# Patient Record
Sex: Male | Born: 1990 | Race: Black or African American | Hispanic: No | Marital: Single | Smoking: Never smoker
Health system: Southern US, Community
[De-identification: ages and names within clinical notes are randomized; demographics above are authoritative.]

## PROBLEM LIST (undated history)

## (undated) DIAGNOSIS — N200 Calculus of kidney: Secondary | ICD-10-CM

---

## 2012-04-11 ENCOUNTER — Encounter (HOSPITAL_COMMUNITY): Payer: Self-pay | Admitting: *Deleted

## 2012-04-11 ENCOUNTER — Emergency Department (HOSPITAL_COMMUNITY): Payer: BC Managed Care – PPO

## 2012-04-11 ENCOUNTER — Emergency Department (HOSPITAL_COMMUNITY)
Admission: EM | Admit: 2012-04-11 | Discharge: 2012-04-11 | Disposition: A | Discharge status: Home / Self Care | Payer: BC Managed Care – PPO | Attending: Emergency Medicine | Admitting: Emergency Medicine

## 2012-04-11 DIAGNOSIS — R109 Unspecified abdominal pain: Secondary | ICD-10-CM | POA: Insufficient documentation

## 2012-04-11 DIAGNOSIS — N201 Calculus of ureter: Secondary | ICD-10-CM | POA: Insufficient documentation

## 2012-04-11 LAB — URINALYSIS, MICROSCOPIC ONLY
Bilirubin Urine: NEGATIVE
Ketones, ur: NEGATIVE mg/dL
Nitrite: NEGATIVE
Specific Gravity, Urine: 1.026 (ref 1.005–1.030)
Urobilinogen, UA: 0.2 mg/dL (ref 0.0–1.0)
pH: 7.5 (ref 5.0–8.0)

## 2012-04-11 LAB — BASIC METABOLIC PANEL
BUN: 14 mg/dL (ref 6–23)
Chloride: 102 mEq/L (ref 96–112)
Creatinine, Ser: 1.14 mg/dL (ref 0.50–1.35)
GFR calc Af Amer: >90 mL/min (ref >90)
GFR calc non Af Amer: >90 mL/min (ref >90)
Potassium: 3.9 mEq/L (ref 3.5–5.1)

## 2012-04-11 LAB — CBC
MCHC: 34.1 g/dL (ref 30.0–36.0)
Platelets: 200 10*3/uL (ref 150–400)
RDW: 13.2 % (ref 11.5–15.5)
WBC: 4.9 10*3/uL (ref 4.0–10.5)

## 2012-04-11 MED ORDER — OXYCODONE-ACETAMINOPHEN 5-325 MG PO TABS: 1.0000 | ORAL_TABLET | ORAL | Status: AC | PRN

## 2012-04-11 MED ORDER — HYDROMORPHONE HCL PF 1 MG/ML IJ SOLN
1.0000 mg | Freq: Once | INTRAMUSCULAR | Status: AC
  Administered 2012-04-11: 1 mg via INTRAVENOUS
  Filled 2012-04-11: qty 1

## 2012-04-11 MED ORDER — ONDANSETRON 8 MG PO TBDP: 8.0000 mg | ORAL_TABLET | Freq: Three times a day (TID) | ORAL | Status: AC | PRN

## 2012-04-11 NOTE — ED Notes (Signed)
Pt states "having pain in my testicles, mainly my right, also right fingers and toes going numb"

## 2012-04-11 NOTE — ED Notes (Signed)
Patient transported to Ultrasound 

## 2012-04-11 NOTE — ED Provider Notes (Signed)
History     CSN: [id number]  Arrival date & time 04/11/12  0901   First MD Initiated Contact with Patient 04/11/12 0920      Chief Complaint  Patient presents with  . Testicle Pain     The history is provided by the patient.   the patient reports he awoke this morning with acute onset right testicular pain.  Reports the pain radiates up into his right groin.  He denies radiation of his pain to his right flank.  He has no dysuria or urinary frequency.  His had no penile discharge.  He denies abdominal pain nausea and vomiting. His pain is moderate to severe at this time.  He reports his right testicle was slightly swollen earlier but reports it is now normalized in size.  He's never had these symptoms before.  History reviewed. No pertinent past medical history.  History reviewed. No pertinent past surgical history.  No family history on file.  History  Substance Use Topics  . Smoking status: Former Games developer  . Smokeless tobacco: Not on file  . Alcohol Use: Yes     socially      Review of Systems  Genitourinary: Positive for testicular pain.  All other systems reviewed and are negative.    Allergies  Review of patient's allergies indicates no known allergies.  Home Medications  No current outpatient prescriptions on file.  BP 119/77  Pulse 87  Temp 97.8 F (36.6 C)  Resp 20  Wt 170 lb (77.111 kg)  SpO2 100%  Physical Exam  Nursing note and vitals reviewed. Constitutional: He is oriented to person, place, and time. He appears well-developed and well-nourished.  HENT:  Head: Normocephalic and atraumatic.  Eyes: EOM are normal.  Neck: Normal range of motion.  Cardiovascular: Normal rate, regular rhythm, normal heart sounds and intact distal pulses.   Pulmonary/Chest: Effort normal and breath sounds normal. No respiratory distress.  Abdominal: Soft. He exhibits no distension. There is no tenderness.  Genitourinary:       No significant swelling of right  testicle.  Overlying scrotum is normal.  Penis is normal.  Penis is circumcised.  No discharge noted from penis.  He has no palpable inguinal hernias bilaterally.   Musculoskeletal: Normal range of motion.  Neurological: He is alert and oriented to person, place, and time.  Skin: Skin is warm and dry.  Psychiatric: He has a normal mood and affect. Judgment normal.    ED Course  Procedures (including critical care time)  Labs Reviewed  CBC - Abnormal; Notable for the following:    RBC 5.98 (*)    MCV 76.6 (*)    All other components within normal limits  BASIC METABOLIC PANEL - Abnormal; Notable for the following:    Glucose, Bld 110 (*)    All other components within normal limits  URINALYSIS, WITH MICROSCOPIC - Abnormal; Notable for the following:    APPearance CLOUDY (*)    Hgb urine dipstick LARGE (*)    Protein, ur 30 (*)    Leukocytes, UA TRACE (*)    Bacteria, UA FEW (*)    Squamous Epithelial / LPF FEW (*)    All other components within normal limits   US Scrotum  04/11/2012  *RADIOLOGY REPORT*  Clinical Data: Right testicular pain.  Evaluate for possible torsion.  SCROTAL ULTRASOUND DOPPLER ULTRASOUND OF THE TESTICLES  Technique:  Complete ultrasound examination of the testicles, epididymis, and other scrotal structures was performed.  Color and spectral Doppler  ultrasound were also utilized to evaluate blood flow to the testicles.  Comparison:  None.  Findings:  The testicles are symmetric in size and echogenicity. The right testis measures 4.1 x 2.0 x 2.7 cm and the left testis measures 3.7 x 1.9 x 2.4 cm.  No testicular masses are seen, and there is no evidence of microlithiasis.  Both epididymal heads are unremarkable in appearance aside from tiny cysts or spermatoceles.  There is no significant hydrocele. There is no evidence of varicocele or other extra-testicular abnormality.  Blood flow is seen within both testicles on color Doppler sonography.  Doppler spectral waveforms  show both arterial and venous flow signal in both testicles.  IMPRESSION: 1.  No evidence of testicular mass or torsion.  Original Report Authenticated By: Gerrianne Scale, M.D.   [place] Art/ven Flow Abd Pelv Doppler  04/11/2012  *RADIOLOGY REPORT*  Clinical Data: Right testicular pain.  Evaluate for possible torsion.  SCROTAL ULTRASOUND DOPPLER ULTRASOUND OF THE TESTICLES  Technique:  Complete ultrasound examination of the testicles, epididymis, and other scrotal structures was performed.  Color and spectral Doppler ultrasound were also utilized to evaluate blood flow to the testicles.  Comparison:  None.  Findings:  The testicles are symmetric in size and echogenicity. The right testis measures 4.1 x 2.0 x 2.7 cm and the left testis measures 3.7 x 1.9 x 2.4 cm.  No testicular masses are seen, and there is no evidence of microlithiasis.  Both epididymal heads are unremarkable in appearance aside from tiny cysts or spermatoceles.  There is no significant hydrocele. There is no evidence of varicocele or other extra-testicular abnormality.  Blood flow is seen within both testicles on color Doppler sonography.  Doppler spectral waveforms show both arterial and venous flow signal in both testicles.  IMPRESSION: 1.  No evidence of testicular mass or torsion.  Original Report Authenticated By: Gerrianne Scale, M.D.     1. Ureteral stone       MDM  The patient's pain will be treated in ultrasound obtained of his right testicle.  I don't feel any inguinal hernias.  The patient has no abdominal tenderness at this time.  He has no radiation up to his flank.  My suspicion for ureteral stone is low.  Urinalysis pending.  No discharge from penis.  11:08 AM The patient feels much better at this time.  His ultrasound is unrevealing.  He did have pain radiating up into his right groin.  He has a large amount hemoglobin on his urine.  This is likely a distal right ureteral stone given the acuity of his symptoms  hematuria and his description of the pain radiating down into his testicle.  Discharge home in good condition        Lyanne Co, MD 04/11/12 1109

## 2012-04-11 NOTE — Discharge Instructions (Signed)
Ureteral Colic (Kidney Stones) Ureteral colic is the result of a condition when kidney stones form inside the kidney. Once kidney stones are formed they may move into the tube that connects the kidney with the bladder (ureter). If this occurs, this condition may cause pain (colic) in the ureter.  CAUSES  Pain is caused by stone movement in the ureter and the obstruction caused by the stone. SYMPTOMS  The pain comes and goes as the ureter contracts around the stone. The pain is usually intense, sharp, and stabbing in character. The location of the pain may move as the stone moves through the ureter. When the stone is near the kidney the pain is usually located in the back and radiates to the belly (abdomen). When the stone is ready to pass into the bladder the pain is often located in the lower abdomen on the side the stone is located. At this location, the symptoms may mimic those of a urinary tract infection with urinary frequency. Once the stone is located here it often passes into the bladder and the pain disappears completely. TREATMENT   Your caregiver will provide you with medicine for pain relief.   You may require specialized follow-up X-rays.   The absence of pain does not always mean that the stone has passed. It may have just stopped moving. If the urine remains completely obstructed, it can cause loss of kidney function or even complete destruction of the involved kidney. It is your responsibility and in your interest that X-rays and follow-ups as suggested by your caregiver are completed. Relief of pain without passage of the stone can be associated with severe damage to the kidney, including loss of kidney function on that side.   If your stone does not pass on its own, additional measures may be taken by your caregiver to ensure its removal.  HOME CARE INSTRUCTIONS   Increase your fluid intake. Water is the preferred fluid since juices containing vitamin C may acidify the urine making  it less likely for certain stones (uric acid stones) to pass.   Strain all urine. A strainer will be provided. Keep all particulate matter or stones for your caregiver to inspect.   Take your pain medicine as directed.   Make a follow-up appointment with your caregiver as directed.   Remember that the goal is passage of your stone. The absence of pain does not mean the stone is gone. Follow your caregiver's instructions.   Only take over-the-counter or prescription medicines for pain, discomfort, or fever as directed by your caregiver.  SEEK MEDICAL CARE IF:   Pain cannot be controlled with the prescribed medicine.   You have a fever.   Pain continues for longer than your caregiver advises it should.   There is a change in the pain, and you develop chest discomfort or constant abdominal pain.   You feel faint or pass out.  MAKE SURE YOU:   Understand these instructions.   Will watch your condition.   Will get help right away if you are not doing well or get worse.  Document Released: 08/18/2005 Document Revised: 10/28/2011 Document Reviewed: 05/05/2011 [employer] Patient Information 2012 [employer]. 

## 2012-04-11 NOTE — ED Notes (Signed)
Pt states that he had an episode of R hand and leg numbess.  States his fingers turned lavender.  Pt states numbness and tingling has gone away.  Cap refill under 3 seconds and radial pulse strong on R hand, normal sensation present.

## 2012-11-30 ENCOUNTER — Emergency Department (HOSPITAL_COMMUNITY)
Admission: EM | Admit: 2012-11-30 | Discharge: 2012-11-30 | Disposition: A | Discharge status: Home / Self Care | Payer: BC Managed Care – PPO | Attending: Emergency Medicine | Admitting: Emergency Medicine

## 2012-11-30 ENCOUNTER — Emergency Department (HOSPITAL_COMMUNITY): Payer: BC Managed Care – PPO

## 2012-11-30 ENCOUNTER — Encounter (HOSPITAL_COMMUNITY): Payer: Self-pay | Admitting: *Deleted

## 2012-11-30 DIAGNOSIS — N509 Disorder of male genital organs, unspecified: Secondary | ICD-10-CM | POA: Insufficient documentation

## 2012-11-30 DIAGNOSIS — Z87891 Personal history of nicotine dependence: Secondary | ICD-10-CM | POA: Insufficient documentation

## 2012-11-30 DIAGNOSIS — N200 Calculus of kidney: Secondary | ICD-10-CM | POA: Insufficient documentation

## 2012-11-30 DIAGNOSIS — Z87442 Personal history of urinary calculi: Secondary | ICD-10-CM | POA: Insufficient documentation

## 2012-11-30 HISTORY — DX: Calculus of kidney: N20.0

## 2012-11-30 LAB — CBC WITH DIFFERENTIAL/PLATELET
Basophils Relative: 0 % (ref 0–1)
Eosinophils Relative: 0 % (ref 0–5)
HCT: 44.2 % (ref 39.0–52.0)
Lymphs Abs: 1.2 10*3/uL (ref 0.7–4.0)
MCH: 26.3 pg (ref 26.0–34.0)
MCV: 74.5 fL — ABNORMAL LOW (ref 78.0–100.0)
Monocytes Absolute: 0.6 10*3/uL (ref 0.1–1.0)
Monocytes Relative: 9 % (ref 3–12)
Neutro Abs: 4.6 10*3/uL (ref 1.7–7.7)
RBC: 5.93 MIL/uL — ABNORMAL HIGH (ref 4.22–5.81)
WBC: 6.4 10*3/uL (ref 4.0–10.5)

## 2012-11-30 LAB — URINE MICROSCOPIC-ADD ON

## 2012-11-30 LAB — URINALYSIS, ROUTINE W REFLEX MICROSCOPIC
Nitrite: NEGATIVE
Protein, ur: 30 mg/dL — AB
Urobilinogen, UA: 1 mg/dL (ref 0.0–1.0)

## 2012-11-30 LAB — COMPREHENSIVE METABOLIC PANEL
AST: 25 U/L (ref 0–37)
BUN: 11 mg/dL (ref 6–23)
CO2: 25 mEq/L (ref 19–32)
Calcium: 10.5 mg/dL (ref 8.4–10.5)
Chloride: 98 mEq/L (ref 96–112)
Creatinine, Ser: 0.84 mg/dL (ref 0.50–1.35)
GFR calc Af Amer: >90 mL/min (ref >90)
GFR calc non Af Amer: >90 mL/min (ref >90)
Glucose, Bld: 91 mg/dL (ref 70–99)
Total Bilirubin: 0.8 mg/dL (ref 0.3–1.2)

## 2012-11-30 MED ORDER — NAPROXEN 500 MG PO TABS: 500.0000 mg | ORAL_TABLET | Freq: Two times a day (BID) | ORAL | Status: DC

## 2012-11-30 MED ORDER — MORPHINE SULFATE 4 MG/ML IJ SOLN
8.0000 mg | Freq: Once | INTRAMUSCULAR | Status: AC
  Administered 2012-11-30: 8 mg via INTRAVENOUS
  Filled 2012-11-30: qty 2

## 2012-11-30 MED ORDER — IOHEXOL 300 MG/ML  SOLN
100.0000 mL | Freq: Once | INTRAMUSCULAR | Status: AC | PRN
  Administered 2012-11-30: 100 mL via INTRAVENOUS

## 2012-11-30 MED ORDER — OXYCODONE-ACETAMINOPHEN 5-325 MG PO TABS: 1.0000 | ORAL_TABLET | Freq: Four times a day (QID) | ORAL | Status: DC | PRN

## 2012-11-30 MED ORDER — PERCOCET 5-325 MG PO TABS: 1.0000 | ORAL_TABLET | Freq: Four times a day (QID) | ORAL | Status: DC | PRN

## 2012-11-30 MED ORDER — ONDANSETRON HCL 4 MG/2ML IJ SOLN
4.0000 mg | Freq: Once | INTRAMUSCULAR | Status: AC
  Administered 2012-11-30: 4 mg via INTRAVENOUS
  Filled 2012-11-30: qty 2

## 2012-11-30 MED ORDER — KETOROLAC TROMETHAMINE 30 MG/ML IJ SOLN
30.0000 mg | Freq: Once | INTRAMUSCULAR | Status: AC
  Administered 2012-11-30: 30 mg via INTRAVENOUS
  Filled 2012-11-30: qty 1

## 2012-11-30 MED ORDER — SODIUM CHLORIDE 0.9 % IV SOLN
Freq: Once | INTRAVENOUS | Status: AC
  Administered 2012-11-30: 10 mL/h via INTRAVENOUS

## 2012-11-30 NOTE — ED Notes (Signed)
Patient given a urinal but can't void at present moment. Nurse aware

## 2012-11-30 NOTE — ED Notes (Signed)
Pt return from CT at this time.

## 2012-11-30 NOTE — ED Notes (Signed)
Ultrasound at bedside

## 2012-11-30 NOTE — ED Notes (Signed)
PA at bedside.

## 2012-11-30 NOTE — ED Notes (Addendum)
Pt to CT at this time.

## 2012-11-30 NOTE — ED Provider Notes (Signed)
History     CSN: [id number]  Arrival date & time 11/30/12  1635   First MD Initiated Contact with Patient 11/30/12 1701      Chief Complaint  Patient presents with  . Abdominal Pain  . Testicle Pain    (Consider location/radiation/quality/duration/timing/severity/associated sxs/prior treatment) HPI Comments: Thomas Green is a 22 y.o. male w no sig PMHx presents to the ER w RLQ abdominal pain that radiates down to groin. Onset began at noon, progression was sudden. Described as pressure in testicle and sharp in abdomen. Severity 10/10. Associated symptoms include light headedness. Pt denies fever, night sweats, chills, change in bowel movements, urinary symptoms or hx of abdominal surgery. Pt states he suffered from similar type episode last year and was told he had a kidney stone.    Patient is a 22 y.o. male presenting with abdominal pain and testicular pain.  Abdominal Pain The primary symptoms of the illness include abdominal pain. The primary symptoms of the illness do not include fever, fatigue, shortness of breath, nausea, vomiting, diarrhea, hematemesis, hematochezia or dysuria.  Symptoms associated with the illness do not include diaphoresis or urgency.  Testicle Pain Associated symptoms include abdominal pain. Pertinent negatives include no congestion, coughing, diaphoresis, fatigue, fever, headaches, myalgias, nausea, neck pain or vomiting.    Past Medical History  Diagnosis Date  . Kidney stones     History reviewed. No pertinent past surgical history.  History reviewed. No pertinent family history.  History  Substance Use Topics  . Smoking status: Former Games developer  . Smokeless tobacco: Not on file  . Alcohol Use: Yes     Comment: socially      Review of Systems  Constitutional: Negative for fever, diaphoresis, activity change and fatigue.  HENT: Negative for congestion and neck pain.   Respiratory: Negative for cough and shortness of breath.   Gastrointestinal:  Positive for abdominal pain. Negative for nausea, vomiting, diarrhea, blood in stool, hematochezia and hematemesis.  Genitourinary: Positive for testicular pain. Negative for dysuria, urgency, flank pain, discharge, scrotal swelling and genital sores.  Musculoskeletal: Negative for myalgias.  Skin: Negative for color change and wound.  Neurological: Negative for headaches.  All other systems reviewed and are negative.    Allergies  Review of patient's allergies indicates no known allergies.  Home Medications  No current outpatient prescriptions on file.  BP 159/71  Pulse 91  Temp 98.2 F (36.8 C) (Oral)  Resp 20  SpO2 98%  Physical Exam  Nursing note and vitals reviewed. Constitutional: He is oriented to person, place, and time. He appears well-developed and well-nourished. No distress.  HENT:  Head: Normocephalic and atraumatic.  Eyes: Conjunctivae normal and EOM are normal.  Neck: Normal range of motion.  Cardiovascular:       RRR  Pulmonary/Chest: Effort normal.       LCAB  Abdominal:       RLQ ttp, abdomen soft w normal bowel sounds  Genitourinary:       Exam chaperoned. Penis circumcised.  Right testicle tender, overlying scrotum normal appearing. No discharge or sores seen. No palpable hernia.   Musculoskeletal: Normal range of motion.  Neurological: He is alert and oriented to person, place, and time.  Skin: Skin is warm and dry. No rash noted. He is not diaphoretic.  Psychiatric: He has a normal mood and affect. His behavior is normal.    ED Course  Procedures (including critical care time)  Labs Reviewed  CBC WITH DIFFERENTIAL - Abnormal; Notable for  the following:    RBC 5.93 (*)     MCV 74.5 (*)     All other components within normal limits  COMPREHENSIVE METABOLIC PANEL - Abnormal; Notable for the following:    Total Protein 8.9 (*)     All other components within normal limits  URINALYSIS, ROUTINE W REFLEX MICROSCOPIC - Abnormal; Notable for the  following:    Color, Urine AMBER (*)  BIOCHEMICALS MAY BE AFFECTED BY COLOR   APPearance CLOUDY (*)     Hgb urine dipstick LARGE (*)     Ketones, ur 15 (*)     Protein, ur 30 (*)     Leukocytes, UA TRACE (*)     All other components within normal limits  URINE MICROSCOPIC-ADD ON   US Scrotum  11/30/2012  *RADIOLOGY REPORT*  Clinical Data: Right-sided scrotal pain.  ULTRASOUND OF SCROTUM  DOPPLER ULTRASOUND OF SCROTAL VESSELS  Technique:  Complete ultrasound examination of the testicles, epididymis, and other scrotal structures was performed.  Color and duplex Doppler ultrasound was utilized to evaluate blood flow to the testicles and scrotal contents.  Comparison: 04/11/2012.  Findings: The right testicle measures 37 mm x 22 mm x 27 mm. Normal color flow.  Normal arterial and venous waveforms. Epididymis appears within normal limits without hypervascularity.  The left testicle measures 36 mm x 20 mm x 25 mm.  Normal color flow.  Normal arterial and venous waveforms.  Epididymis appears within normal limits without hypervascularity.  Small left hydrocele is present.  No varicocele.  Testicular echogenicity is within normal limits without testicular mass.  IMPRESSION: No evidence of testicular torsion.  Normal testicular ultrasound.   Original Report Authenticated By: Andreas Newport, M.D.    Ct Abdomen Pelvis W Contrast  11/30/2012  *RADIOLOGY REPORT*  Clinical Data: Right lower quadrant pain, right testicular pain  CT ABDOMEN AND PELVIS WITH CONTRAST  Technique:  Multidetector CT imaging of the abdomen and pelvis was performed following the standard protocol during bolus administration of intravenous contrast.  Contrast: OMNIPAQUE IOHEXOL 300 MG/ML  SOLN  Comparison: Scrotal ultrasound same day.  Findings:  Lung bases are unremarkable.  There is no ascites or free air.  Liver, spleen, pancreas and adrenals are unremarkable. Kidneys are symmetrical in enhancement.  There is mild right hydronephrosis  and right hydroureter.  A nonobstructive calcified calculus in mid pole of the right kidney measures 2 mm.  No calcified gallstones are noted within gallbladder.  No small bowel obstruction.  No ascites or free air.  No adenopathy.  There is no pericecal inflammation.  Oral contrast material was given to the patient.  No distal small bowel obstruction.  Normal appendix is clearly visualized in axial image 61.  There is a calcification in the right pelvis measures 3 mm highly suspicious for a distal right ureteral calculus.  Limited assessment of urinary bladder which is empty.  A low-lying cecum is noted.  Stool noted within cecum.  The tip of the cecum is in the right pelvis adjacent to the midline just above the urinary bladder.  No destructive bony lesions are noted within pelvis. Small amount of free fluid noted in the right posterior cul-de-sac.  IMPRESSION:  1.  There is mild right hydronephrosis and right hydroureter. 2.  Normal appendix is clearly visualized. 3.  A low-lying cecum is noted.  Stool noted within cecum.  No pericecal inflammation. 4.  There is a 3 mm calcification in the right pelvis best seen in axial image  59 highly suspicious for a distal right ureteral calculus. 5.  Limited assessment of the urinary bladder which is empty.   Original Report Authenticated By: Natasha Mead, M.D.    [place] Art/ven Flow Abd Pelv Doppler  11/30/2012  *RADIOLOGY REPORT*  Clinical Data: Right-sided scrotal pain.  ULTRASOUND OF SCROTUM  DOPPLER ULTRASOUND OF SCROTAL VESSELS  Technique:  Complete ultrasound examination of the testicles, epididymis, and other scrotal structures was performed.  Color and duplex Doppler ultrasound was utilized to evaluate blood flow to the testicles and scrotal contents.  Comparison: 04/11/2012.  Findings: The right testicle measures 37 mm x 22 mm x 27 mm. Normal color flow.  Normal arterial and venous waveforms. Epididymis appears within normal limits without hypervascularity.  The left  testicle measures 36 mm x 20 mm x 25 mm.  Normal color flow.  Normal arterial and venous waveforms.  Epididymis appears within normal limits without hypervascularity.  Small left hydrocele is present.  No varicocele.  Testicular echogenicity is within normal limits without testicular mass.  IMPRESSION: No evidence of testicular torsion.  Normal testicular ultrasound.   Original Report Authenticated By: Andreas Newport, M.D.      No diagnosis found.    MDM  Distal ureteral stone  Pt has been diagnosed with a Kidney Stone via CT. There is no evidence of significant hydronephrosis, serum creatine WNL, vitals sign stable and the pt does not have irratractable vomiting. Pt will be dc home with pain medications & has been advised to follow up with PCP.          Jaci Carrel, [place] 11/30/12 1949

## 2012-11-30 NOTE — ED Notes (Addendum)
Pt in c/o RLQ pain and right testicle pain x1 day, pt states he has history of kidney stones with similar symptoms in the past, pt noted to be pale, states he feels dizzy and thinks he is going to pass out.

## 2012-12-01 NOTE — ED Provider Notes (Signed)
Medical screening examination/treatment/procedure(s) were performed by non-physician practitioner and as supervising physician I was immediately available for consultation/collaboration.  Hazelee Harbold R. Anaiya Wisinski, MD 12/01/12 0004 

## 2017-07-21 ENCOUNTER — Emergency Department (HOSPITAL_COMMUNITY)
Admission: EM | Admit: 2017-07-21 | Discharge: 2017-07-21 | Disposition: A | Discharge status: Home / Self Care | Payer: BLUE CROSS/BLUE SHIELD | Attending: Emergency Medicine | Admitting: Emergency Medicine

## 2017-07-21 ENCOUNTER — Encounter (HOSPITAL_COMMUNITY): Payer: Self-pay | Admitting: Emergency Medicine

## 2017-07-21 ENCOUNTER — Emergency Department (HOSPITAL_COMMUNITY): Payer: BLUE CROSS/BLUE SHIELD

## 2017-07-21 DIAGNOSIS — R1032 Left lower quadrant pain: Secondary | ICD-10-CM

## 2017-07-21 DIAGNOSIS — N200 Calculus of kidney: Secondary | ICD-10-CM | POA: Insufficient documentation

## 2017-07-21 DIAGNOSIS — Z87891 Personal history of nicotine dependence: Secondary | ICD-10-CM | POA: Insufficient documentation

## 2017-07-21 DIAGNOSIS — R3129 Other microscopic hematuria: Secondary | ICD-10-CM

## 2017-07-21 LAB — BASIC METABOLIC PANEL
Anion gap: 8 (ref 5–15)
BUN: 11 mg/dL (ref 6–20)
CALCIUM: 9.7 mg/dL (ref 8.9–10.3)
CHLORIDE: 101 mmol/L (ref 101–111)
CO2: 27 mmol/L (ref 22–32)
CREATININE: 1.02 mg/dL (ref 0.61–1.24)
GFR calc Af Amer: >60 mL/min (ref >60)
GFR calc non Af Amer: >60 mL/min (ref >60)
Glucose, Bld: 131 mg/dL — ABNORMAL HIGH (ref 65–99)
Potassium: 4.2 mmol/L (ref 3.5–5.1)
SODIUM: 136 mmol/L (ref 135–145)

## 2017-07-21 LAB — URINALYSIS, ROUTINE W REFLEX MICROSCOPIC
BILIRUBIN URINE: NEGATIVE
GLUCOSE, UA: NEGATIVE mg/dL
KETONES UR: NEGATIVE mg/dL
NITRITE: NEGATIVE
PH: 6 (ref 5.0–8.0)
Protein, ur: NEGATIVE mg/dL
SPECIFIC GRAVITY, URINE: 1.01 (ref 1.005–1.030)
SQUAMOUS EPITHELIAL / LPF: NONE SEEN

## 2017-07-21 LAB — CBC
HCT: 42 % (ref 39.0–52.0)
Hemoglobin: 14.8 g/dL (ref 13.0–17.0)
MCH: 27.2 pg (ref 26.0–34.0)
MCHC: 35.2 g/dL (ref 30.0–36.0)
MCV: 77.1 fL — AB (ref 78.0–100.0)
PLATELETS: 215 10*3/uL (ref 150–400)
RBC: 5.45 MIL/uL (ref 4.22–5.81)
RDW: 13 % (ref 11.5–15.5)
WBC: 6.9 10*3/uL (ref 4.0–10.5)

## 2017-07-21 MED ORDER — ONDANSETRON HCL 4 MG/2ML IJ SOLN: 4.0000 mg | Freq: Four times a day (QID) | INTRAMUSCULAR | Status: DC | PRN

## 2017-07-21 MED ORDER — KETOROLAC TROMETHAMINE 30 MG/ML IJ SOLN
30.0000 mg | Freq: Once | INTRAMUSCULAR | Status: AC
  Administered 2017-07-21: 30 mg via INTRAMUSCULAR
  Filled 2017-07-21: qty 1

## 2017-07-21 MED ORDER — KETOROLAC TROMETHAMINE 30 MG/ML IJ SOLN: 30.0000 mg | Freq: Once | INTRAMUSCULAR | Status: DC

## 2017-07-21 MED ORDER — HYDROCODONE-ACETAMINOPHEN 5-325 MG PO TABS: 1.0000 | ORAL_TABLET | Freq: Four times a day (QID) | ORAL | 0 refills | Status: AC | PRN

## 2017-07-21 MED ORDER — NAPROXEN 500 MG PO TABS: 500.0000 mg | ORAL_TABLET | Freq: Two times a day (BID) | ORAL | 0 refills | Status: AC | PRN

## 2017-07-21 MED ORDER — ONDANSETRON 4 MG PO TBDP: 4.0000 mg | ORAL_TABLET | Freq: Three times a day (TID) | ORAL | 0 refills | Status: AC | PRN

## 2017-07-21 MED ORDER — TAMSULOSIN HCL 0.4 MG PO CAPS: 0.4000 mg | ORAL_CAPSULE | Freq: Every day | ORAL | 0 refills | Status: AC

## 2017-07-21 NOTE — ED Notes (Signed)
Discharge instructions reviewed with patient. Patient verbalizes understanding. VSS.  Patient discharged with urine strainers.

## 2017-07-21 NOTE — Discharge Instructions (Signed)
Take naprosyn as directed as needed for pain using norco for breakthrough pain. Do not drive or operate machinery with pain medication use. May need over-the-counter stool softener with this pain medication use. Use Zofran as needed for nausea. Use Flomax as directed, as this medication will help you pass the stone. Strain all urine to try to catch the stone when it passes. Follow-up with the urologist in the next 1 to 2 weeks for recheck of ongoing pain and ongoing management of your stones, however for intractable or uncontrollable symptoms at home then [facility].

## 2017-07-21 NOTE — ED Triage Notes (Addendum)
Patient reports LLQ pain and trouble with urination that started this morning. Patient reports that he went to Urgent care and was sent to ED for possible kidney stones. Patient was told that had blood in Urine that he gave at urgent care. Patient has PMH kidney stones.

## 2017-07-21 NOTE — ED Provider Notes (Signed)
[facility]-EMERGENCY DEPT Provider Note   CSN: [id number] Arrival date & time: 07/21/17  1044     History   Chief Complaint Chief Complaint  Patient presents with  . sent from urgent care for possible kidney stone    HPI Thomas Green is a 26 y.o. male with a PMHx of nephrolithiasis, who presents to the ED with complaints of sudden onset LLQ pain that began around 4 AM which feels the exact same way as his prior kidney stones have. He describes the pain as initially 10/10 however now 1/10, constant waxing and waning "blunt" LLQ pain that occasionally radiates to the left testicle however currently is nonradiating, worse with sitting when the pain is acting up, and with no treatments tried prior to arrival. He was seen at urgent care and told he had hematuria and sent here for a CT scan to evaluate for kidney stone. He reports that initially he had difficulty urinating however now he has been able to urinate and feels much better, and is having slight urinary frequency and urgency. He has never seen a urologist, and has never needed surgery or intervention in order to pass stones. His last kidney stone was in 2014 which was 3 mm on the R side, which passed without difficulty. He denies fevers, chills, CP, SOB, nausea/vomiting, diarrhea/constipation, obstipation, melena, hematochezia, dysuria, malodorous urine, testicular swelling, ongoing/persistent testicular pain, penile discharge, flank pain, myalgias, arthralgias, numbness, tingling, focal weakness, or any other complaints at this time. Denies recent travel, sick contacts, suspicious food intake, EtOH use, NSAID use, or prior abd surgeries.    The history is provided by the patient and medical records. No language interpreter was used.  Abdominal Pain   This is a new problem. The current episode started 6 to 12 hours ago. The problem occurs constantly. The problem has been rapidly improving. The pain is associated with an unknown factor. The pain is  located in the LLQ. Quality: blunt. The pain is at a severity of 10/10 (10/10 at worst, 1/10 now). The pain is moderate. Associated symptoms include frequency and hematuria. Pertinent negatives include fever, diarrhea, flatus, hematochezia, melena, nausea, vomiting, constipation, dysuria, arthralgias and myalgias. Exacerbated by: sitting. Nothing relieves the symptoms. Past workup includes CT scan. Past medical history comments: kidney stones.    Past Medical History:  Diagnosis Date  . Kidney stones     There are no active problems to display for this patient.   History reviewed. No pertinent surgical history.     Home Medications    Prior to Admission medications   Not on File    Family History No family history on file.  Social History Social History  Substance Use Topics  . Smoking status: Former Games developer  . Smokeless tobacco: Never Used  . Alcohol use Yes     Comment: socially     Allergies   Patient has no known allergies.   Review of Systems Review of Systems  Constitutional: Negative for chills and fever.  Respiratory: Negative for shortness of breath.   Cardiovascular: Negative for chest pain.  Gastrointestinal: Positive for abdominal pain. Negative for blood in stool, constipation, diarrhea, flatus, hematochezia, melena, nausea and vomiting.  Genitourinary: Positive for frequency, hematuria and urgency. Negative for discharge, dysuria, flank pain, scrotal swelling and testicular pain.       No malodorous urine  Musculoskeletal: Negative for arthralgias and myalgias.  Skin: Negative for color change.  Allergic/Immunologic: Negative for immunocompromised state.  Neurological: Negative for weakness and numbness.  Psychiatric/Behavioral: Negative for confusion.   All other systems reviewed and are negative for acute change except as noted in the HPI.    Physical Exam Updated Vital Signs BP (!) 148/92 (BP Location: Left Arm)   Pulse (!) 58   Temp 98.6 F  (37 C) (Oral)   Resp 12   Ht 5\' 8"  (1.727 m)   Wt 76.7 kg (169 lb)   SpO2 100%   BMI 25.70 kg/m   Physical Exam  Constitutional: He is oriented to person, place, and time. Vital signs are normal. He appears well-developed and well-nourished.  Non-toxic appearance. No distress.  Afebrile, nontoxic, NAD  HENT:  Head: Normocephalic and atraumatic.  Mouth/Throat: Oropharynx is clear and moist and mucous membranes are normal.  Eyes: Conjunctivae and EOM are normal. Right eye exhibits no discharge. Left eye exhibits no discharge.  Neck: Normal range of motion. Neck supple.  Cardiovascular: Normal rate, regular rhythm, normal heart sounds and intact distal pulses.  Exam reveals no gallop and no friction rub.   No murmur heard. Pulmonary/Chest: Effort normal and breath sounds normal. No respiratory distress. He has no decreased breath sounds. He has no wheezes. He has no rhonchi. He has no rales.  Abdominal: Soft. Normal appearance and bowel sounds are normal. He exhibits no distension. There is no tenderness. There is no rigidity, no rebound, no guarding, no CVA tenderness, no tenderness at McBurney's point and negative Murphy's sign.  Soft, nondistended, +BS throughout, with no focal abdominal TTP anywhere in the abdomen, no r/g/r, neg murphy's, neg mcburney's, no CVA TTP   Musculoskeletal: Normal range of motion.  Neurological: He is alert and oriented to person, place, and time. He has normal strength. No sensory deficit.  Skin: Skin is warm, dry and intact. No rash noted.  Psychiatric: He has a normal mood and affect.  Nursing note and vitals reviewed.    ED Treatments / Results  Labs (all labs ordered are listed, but only abnormal results are displayed) Labs Reviewed  URINALYSIS, ROUTINE W REFLEX MICROSCOPIC - Abnormal; Notable for the following:       Result Value   Hgb urine dipstick SMALL (*)    Leukocytes, UA TRACE (*)    Bacteria, UA RARE (*)    All other components within  normal limits  CBC - Abnormal; Notable for the following:    MCV 77.1 (*)    All other components within normal limits  BASIC METABOLIC PANEL - Abnormal; Notable for the following:    Glucose, Bld 131 (*)    All other components within normal limits  URINE CULTURE    EKG  EKG Interpretation None       Radiology Ct Renal Stone Study  Result Date: 07/21/2017 CLINICAL DATA:  26 year old male with left lower quadrant pain and difficulty urinating. EXAM: CT ABDOMEN AND PELVIS WITHOUT CONTRAST TECHNIQUE: Multidetector CT imaging of the abdomen and pelvis was performed following the standard protocol without IV contrast. COMPARISON:  11/30/2012 FINDINGS: Lower chest: No acute abnormality. Hepatobiliary: No focal liver abnormality is seen. No gallstones, gallbladder wall thickening, or biliary dilatation. Pancreas: Unremarkable. No pancreatic ductal dilatation or surrounding inflammatory changes. Spleen: Normal in size without focal abnormality. Adrenals/Urinary Tract: Adrenal glands are unremarkable. A 5 mm calculus identified in the distal left ureter with mild proximal ureteral dilatation. There is no significant hydronephrosis, periureteral or perinephric fat stranding. A nonobstructing 6 mm stone is noted at the right lower pole. The right kidney is otherwise unremarkable. No focal  lesion or hydronephrosis. Bladder is decompressed. Stomach/Bowel: Stomach is within normal limits. Appendix appears normal. No evidence of bowel wall thickening, distention, or inflammatory changes. Vascular/Lymphatic: No significant vascular findings are present. No enlarged abdominal or pelvic lymph nodes. Reproductive: Prostate is unremarkable. Other: No abdominal wall hernia or abnormality. No abdominopelvic ascites. Musculoskeletal: No acute or significant osseous findings. IMPRESSION: 1. 5 mm stone in the distal left ureter, approximately 1-2 cm proximal from the UVJ. There is a proximal ureteral dilatation,  suggesting early/partial obstruction. No hydronephrosis, periureteral or perinephric fat stranding. 2. Nonobstructing 6 mm calculus within the renal pelvis at the right lower pole. Electronically Signed   By: Sande Brothers M.D.   On: 07/21/2017 11:50    Procedures Procedures (including critical care time)  Medications Ordered in ED Medications  ketorolac (TORADOL) 30 MG/ML injection 30 mg (30 mg Intramuscular Given 07/21/17 1536)     Initial Impression / Assessment and Plan / ED Course  I have reviewed the triage vital signs and the nursing notes.  Pertinent labs & imaging results that were available during my care of the patient were reviewed by me and considered in my medical decision making (see chart for details).     26 y.o. male here with LLQ pain that feels like stone, went to urgent care and had hematuria so sent here for CT scan. States pain has essentially resolved at this time, on exam, no abdominal or flank tenderness, afebrile and nontoxic. CBC and BMP essentially WNL, CT renal study done in triage shows 5mm distal L ureteral stone near UVJ, and 6mm R renal stone as well. L sided ureteral stone is likely the cause of his symptoms, he could have passed it into his bladder already given his improvement of symptoms. Pt not requesting pain meds, but will give toradol IM to help improve any further pain he may have as stone makes its way out. Awaiting U/A, as long as no evidence of superimposed infection then we can likely send him home. Will reassess shortly.   4:00 PM U/A with small hgb, trace leuks, nitrite neg, 0-5 RBCs and WBCs, rare bacteria and +mucous present; doubt UTI, likely just from irritation due to stone passing through ureter vs contamination, but will send for UCx. Will send home with zofran, naprosyn, norco, and flomax. Urine strainer given. Advised staying hydrated. F/up with urology in 1-2wks but strict return precautions advised. [place] reviewed prior to  dispensing controlled substance medications, and 1 year search was notable for: vicodin 5-325mg  #12 tabs on 02/15/17 and vicodin 7.5-325mg  #12 tabs on 01/13/17. Risks/benefits/alternatives and expectations discussed regarding controlled substances. Side effects of medications discussed. Informed consent obtained. I explained the diagnosis and have given explicit precautions to return to the ER including for any other new or worsening symptoms. The patient understands and accepts the medical plan as it's been dictated and I have answered their questions. Discharge instructions concerning home care and prescriptions have been given. The patient is STABLE and is discharged to home in good condition.    Final Clinical Impressions(s) / ED Diagnoses   Final diagnoses:  Colicky LLQ abdominal pain  Nephrolithiasis  Other microscopic hematuria    New Prescriptions New Prescriptions   HYDROCODONE-ACETAMINOPHEN (NORCO) 5-325 MG TABLET    Take 1 tablet by mouth every 6 (six) hours as needed for severe pain.   NAPROXEN (NAPROSYN) 500 MG TABLET    Take 1 tablet (500 mg total) by mouth 2 (two) times daily as needed for  mild pain, moderate pain or headache (TAKE WITH MEALS.).   ONDANSETRON (ZOFRAN ODT) 4 MG DISINTEGRATING TABLET    Take 1 tablet (4 mg total) by mouth every 8 (eight) hours as needed for nausea or vomiting.   TAMSULOSIN (FLOMAX) 0.4 MG CAPS CAPSULE    Take 1 capsule (0.4 mg total) by mouth daily after supper. Take until stone passes       [address], [place], [place] 07/21/17 1603    Azalia Bilis, MD 07/21/17 1620

## 2017-07-23 LAB — URINE CULTURE: Culture: NO GROWTH

## 2018-11-18 IMAGING — CT CT RENAL STONE PROTOCOL
2 of 4 series · 15 of 46 positions shown, 17 images · non-contrast
Comparison: 11/30/2012

CLINICAL DATA: 26-year-old male with left lower quadrant pain and
difficulty urinating.

EXAM:
CT ABDOMEN AND PELVIS WITHOUT CONTRAST
TECHNIQUE: Multidetector CT imaging of the abdomen and pelvis was performed
following the standard protocol without IV contrast.

[Series 2: abd/pel w/o · axial · non-contrast · 0.74mm/px · z∈[-580,-126]mm · 12 of 103 slices shown, 14 images]
[im 6/103  soft-tissue]
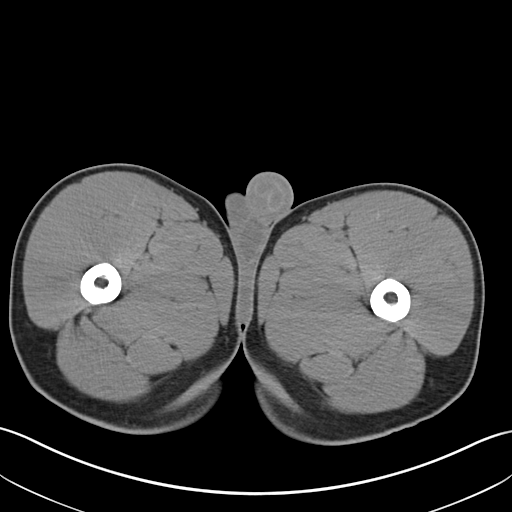
[im 6/103  bone]
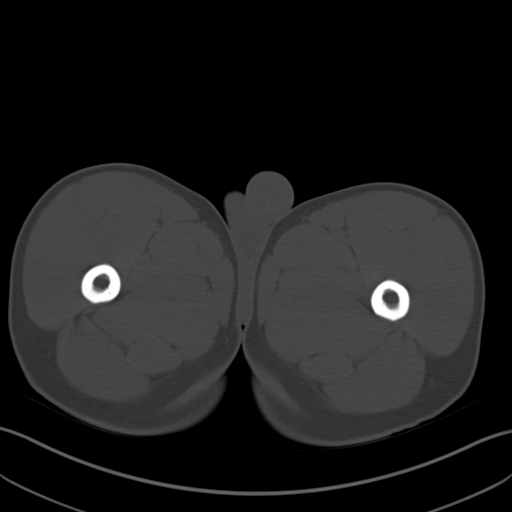
[im 18/103  soft-tissue]
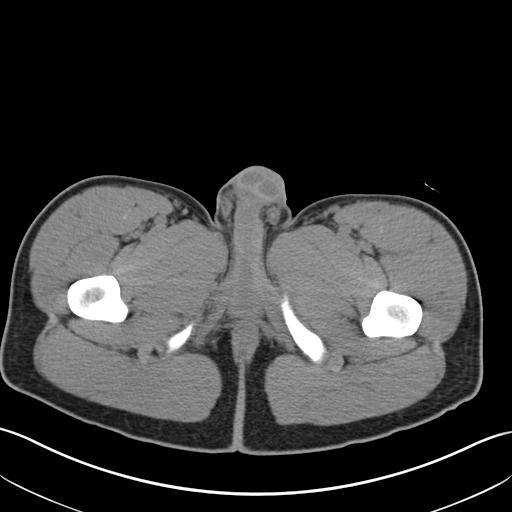
[im 23/103  soft-tissue]
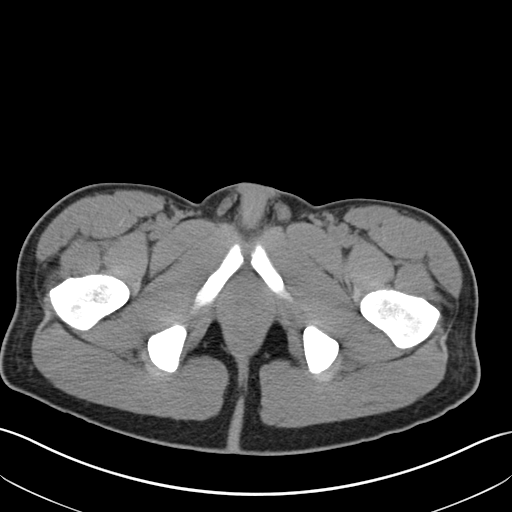
[im 29/103  soft-tissue]
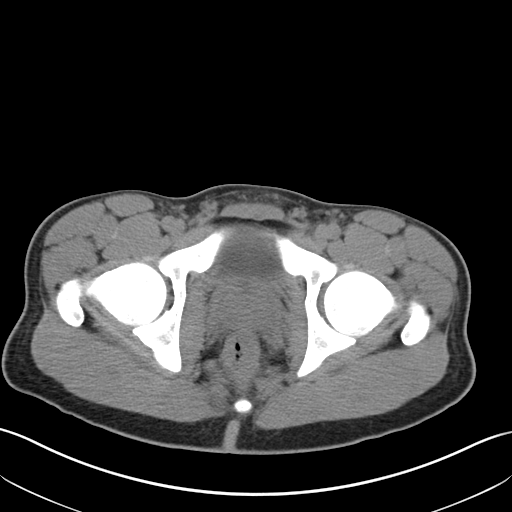
[im 40/103  soft-tissue]
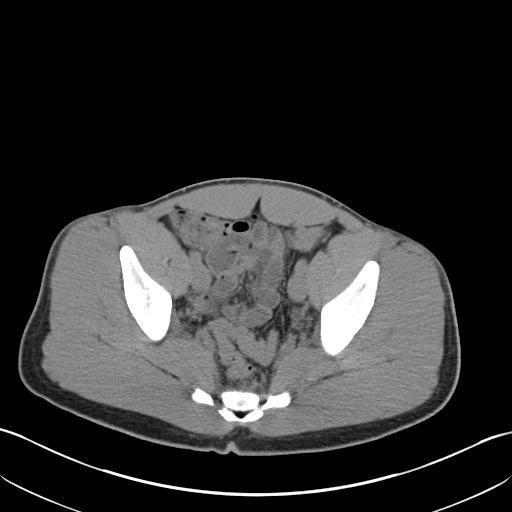
[im 46/103  soft-tissue]
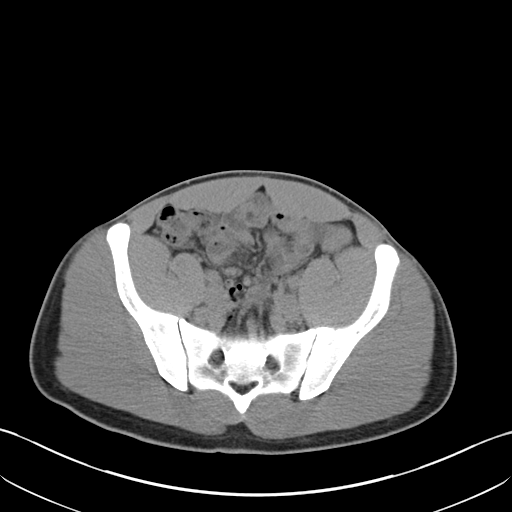
[im 57/103  soft-tissue]
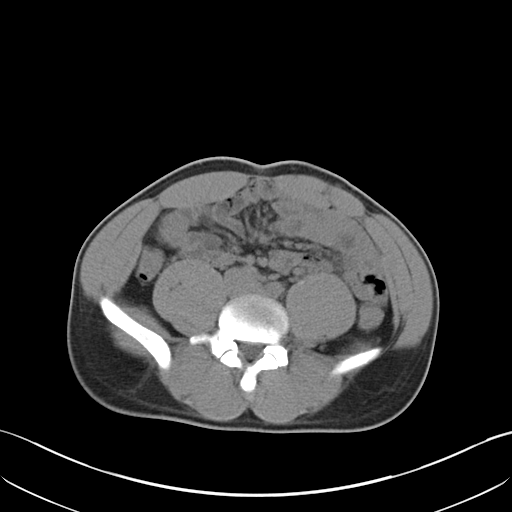
[im 63/103  soft-tissue]
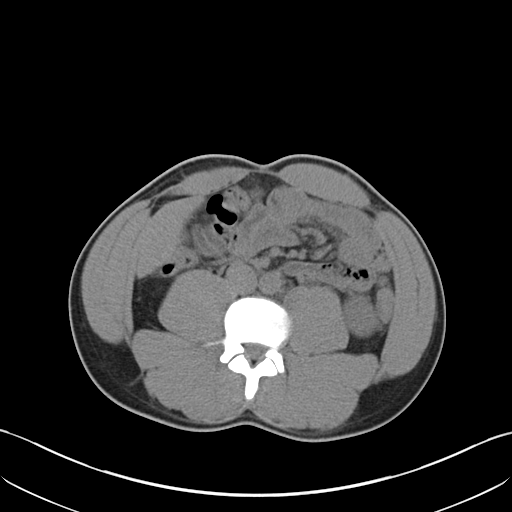
[im 74/103  soft-tissue]
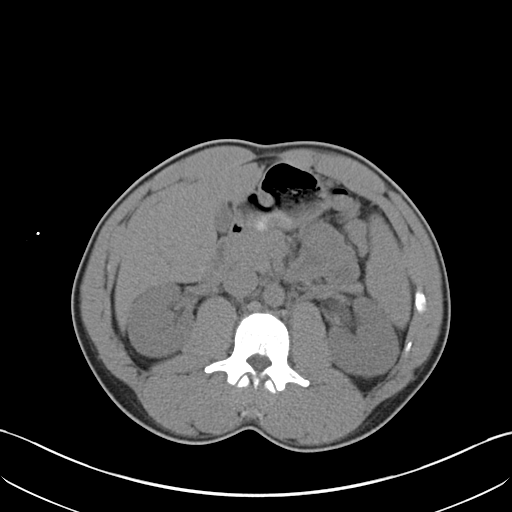
[im 74/103  bone]
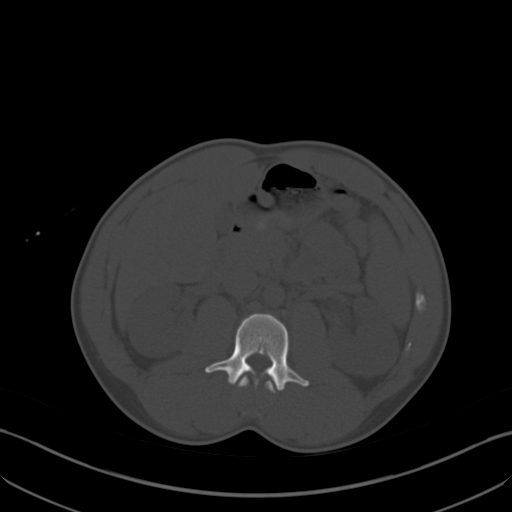
[im 80/103  soft-tissue]
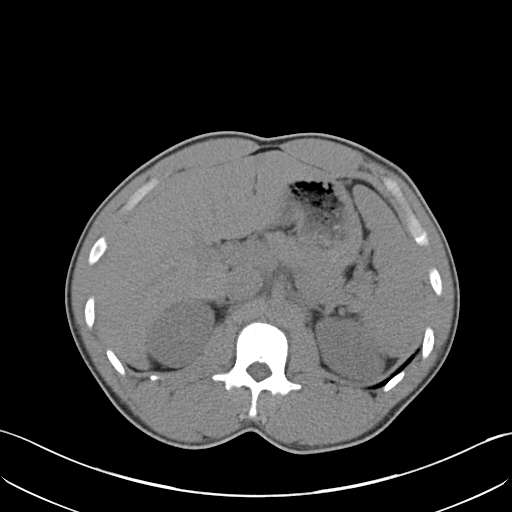
[im 86/103  soft-tissue]
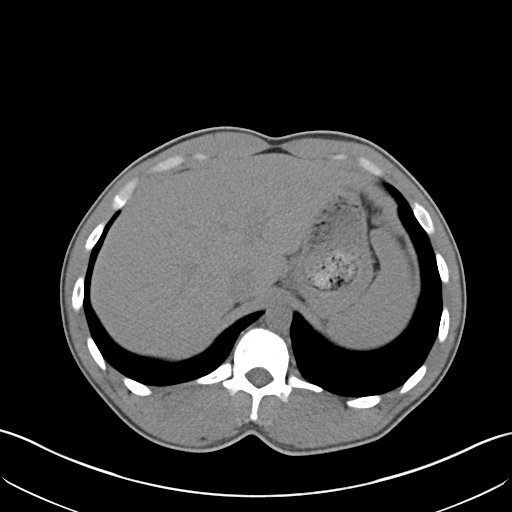
[im 97/103  soft-tissue]
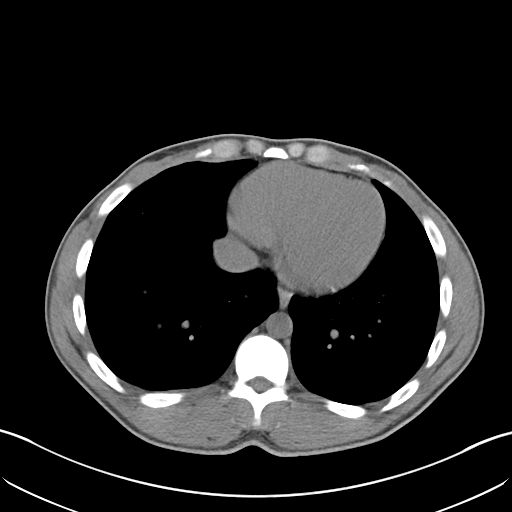

[Series 3: coronal · coronal · 0.65mm/px · 3 of 127 slices shown]
[im 43/127  soft-tissue]
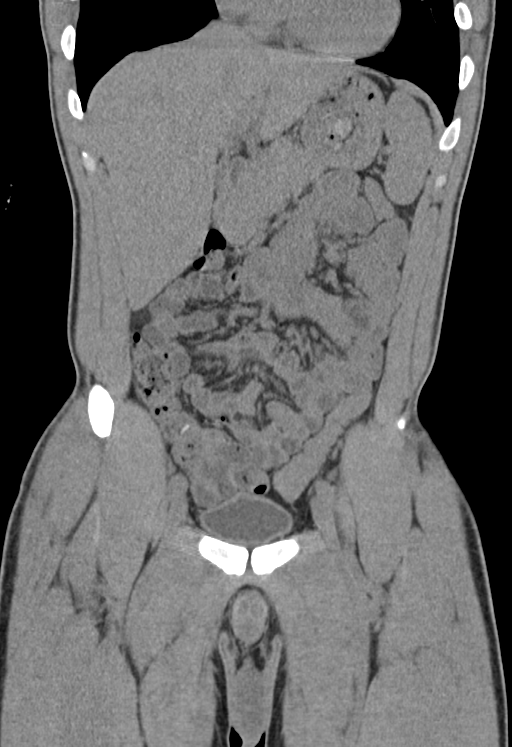
[im 57/127  soft-tissue]
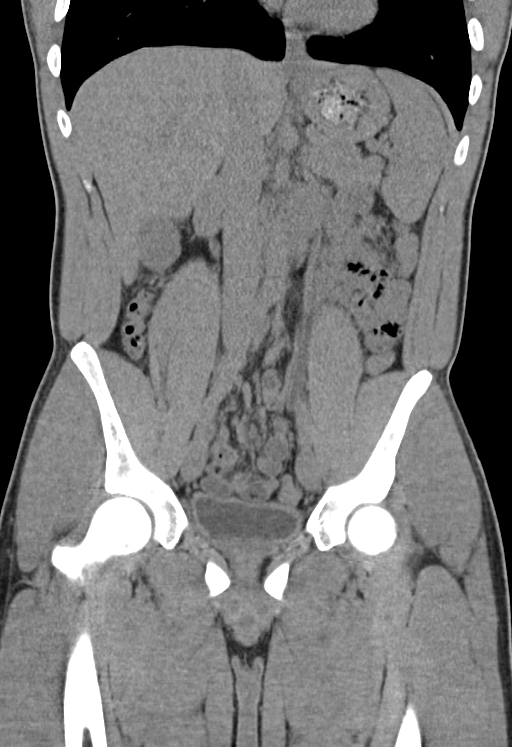
[im 71/127  soft-tissue]
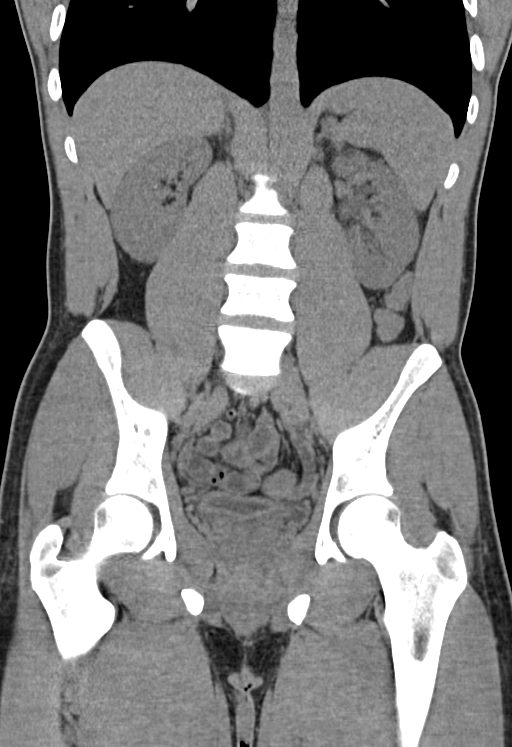

[15 of 46 positions shown; findings below may reference images not displayed]

FINDINGS: Lower chest: No acute abnormality.

Hepatobiliary: No focal liver abnormality is seen. No gallstones,
gallbladder wall thickening, or biliary dilatation.

Pancreas: Unremarkable. No pancreatic ductal dilatation or
surrounding inflammatory changes.

Spleen: Normal in size without focal abnormality.

Adrenals/Urinary Tract: Adrenal glands are unremarkable. A 5 mm
calculus identified in the distal left ureter with mild proximal
ureteral dilatation. There is no significant hydronephrosis,
periureteral or perinephric fat stranding. A nonobstructing 6 mm
stone is noted at the right lower pole. The right kidney is
otherwise unremarkable. No focal lesion or hydronephrosis. Bladder
is decompressed.

Stomach/Bowel: Stomach is within normal limits. Appendix appears
normal. No evidence of bowel wall thickening, distention, or
inflammatory changes.

Vascular/Lymphatic: No significant vascular findings are present. No
enlarged abdominal or pelvic lymph nodes.

Reproductive: Prostate is unremarkable.

Other: No abdominal wall hernia or abnormality. No abdominopelvic
ascites.

Musculoskeletal: No acute or significant osseous findings.
IMPRESSION: 1. 5 mm stone in the distal left ureter, approximately 1-2 cm
proximal from the UVJ. There is a proximal ureteral dilatation,
suggesting early/partial obstruction. No hydronephrosis,
periureteral or perinephric fat stranding.
2. Nonobstructing 6 mm calculus within the renal pelvis at the right
lower pole.

## 2021-04-27 ENCOUNTER — Emergency Department (HOSPITAL_COMMUNITY)
Admission: EM | Admit: 2021-04-27 | Discharge: 2021-04-27 | Disposition: A | Discharge status: Home / Self Care | Payer: BLUE CROSS/BLUE SHIELD | Attending: Emergency Medicine | Admitting: Emergency Medicine

## 2021-04-27 ENCOUNTER — Emergency Department (HOSPITAL_COMMUNITY): Payer: BLUE CROSS/BLUE SHIELD

## 2021-04-27 ENCOUNTER — Encounter (HOSPITAL_COMMUNITY): Payer: Self-pay | Admitting: Emergency Medicine

## 2021-04-27 ENCOUNTER — Other Ambulatory Visit: Payer: Self-pay

## 2021-04-27 DIAGNOSIS — W230XXA Caught, crushed, jammed, or pinched between moving objects, initial encounter: Secondary | ICD-10-CM | POA: Insufficient documentation

## 2021-04-27 DIAGNOSIS — Z23 Encounter for immunization: Secondary | ICD-10-CM | POA: Insufficient documentation

## 2021-04-27 DIAGNOSIS — S61412A Laceration without foreign body of left hand, initial encounter: Secondary | ICD-10-CM | POA: Insufficient documentation

## 2021-04-27 DIAGNOSIS — S60222A Contusion of left hand, initial encounter: Secondary | ICD-10-CM

## 2021-04-27 DIAGNOSIS — Y990 Civilian activity done for income or pay: Secondary | ICD-10-CM | POA: Insufficient documentation

## 2021-04-27 MED ORDER — LIDOCAINE-EPINEPHRINE (PF) 2 %-1:200000 IJ SOLN
20.0000 mL | Freq: Once | INTRAMUSCULAR | Status: AC
  Administered 2021-04-27: 20 mL
  Filled 2021-04-27: qty 20

## 2021-04-27 MED ORDER — TETANUS-DIPHTH-ACELL PERTUSSIS 5-2.5-18.5 LF-MCG/0.5 IM SUSY
0.5000 mL | PREFILLED_SYRINGE | Freq: Once | INTRAMUSCULAR | Status: AC
  Administered 2021-04-27: 0.5 mL via INTRAMUSCULAR
  Filled 2021-04-27: qty 0.5

## 2021-04-27 NOTE — Discharge Instructions (Addendum)
It was our pleasure to provide your ER care today - we hope that you feel better.  Keep area very clean. May shower - pad area gently dry. No taking baths, swimming, or otherwise submerging wound until after sutures have been removed.   Follow up with primary care doctor, or urgent care, in 10-12 days for suture removal.   Return to ER if worse, new symptoms, fevers, severe pain, numbness/weakness, infection of wound, or other concern.

## 2021-04-27 NOTE — ED Triage Notes (Signed)
Pt arrives via [facility] from work for injury to L hand. Pt got his L hand stuck in a machine at work, pulled it out immediately. Pt has full sensation and movement, +2 radial pulses on L hand. EMS gave fentanyl, brought pain from 7/10 to 3/10, EMS gave 2nd dose fentanyl, pain is now a 1/10. AOx4, VSS

## 2021-04-27 NOTE — ED Provider Notes (Signed)
[facility] EMERGENCY DEPARTMENT Provider Note   CSN: [id number] Arrival date & time: 04/27/21  1154     History Chief Complaint  Patient presents with  . Hand Injury    Thomas Green is a 30 y.o. male.  Patient with contusion and laceration to dorsal aspect left hand at work today. Symptoms acute onset, moderate, dull, constant, persistent, non radiating. Unsure of last tetanus. No hand/finger numbness or weakness. Is right hand dominant. Denies any other pain or injury.   The history is provided by the patient and the EMS personnel.  Hand Injury Associated symptoms: no fever        Past Medical History:  Diagnosis Date  . Kidney stones     There are no problems to display for this patient.   History reviewed. No pertinent surgical history.     History reviewed. No pertinent family history.  Social History   Tobacco Use  . Smoking status: Never Smoker  . Smokeless tobacco: Never Used  Vaping Use  . Vaping Use: Never used  Substance Use Topics  . Alcohol use: Yes    Comment: socially  . Drug use: No    Home Medications Prior to Admission medications   Medication Sig Start Date End Date Taking? Authorizing Provider  HYDROcodone-acetaminophen (NORCO) 5-325 MG tablet Take 1 tablet by mouth every 6 (six) hours as needed for severe pain. 07/21/17   Street, Lewis, PA-C  naproxen (NAPROSYN) 500 MG tablet Take 1 tablet (500 mg total) by mouth 2 (two) times daily as needed for mild pain, moderate pain or headache (TAKE WITH MEALS.). 07/21/17   Street, Mercedes, PA-C  ondansetron (ZOFRAN ODT) 4 MG disintegrating tablet Take 1 tablet (4 mg total) by mouth every 8 (eight) hours as needed for nausea or vomiting. 07/21/17   Street, Jefferson, PA-C  tamsulosin (FLOMAX) 0.4 MG CAPS capsule Take 1 capsule (0.4 mg total) by mouth daily after supper. Take until stone passes 07/21/17   Street, [place], [place]    Allergies    Patient has no known  allergies.  Review of Systems   Review of Systems  Constitutional: Negative for fever.  Gastrointestinal: Negative for nausea and vomiting.  Skin: Positive for wound.  Neurological: Negative for numbness.    Physical Exam Updated Vital Signs BP 127/90   Pulse 70   Temp 98.7 F (37.1 C) (Oral)   Resp 12   SpO2 98%   Physical Exam Vitals and nursing note reviewed.  Constitutional:      Appearance: Normal appearance. He is well-developed.  HENT:     Head: Atraumatic.     Nose: Nose normal.     Mouth/Throat:     Mouth: Mucous membranes are moist.  Eyes:     General: No scleral icterus.    Conjunctiva/sclera: Conjunctivae normal.  Neck:     Trachea: No tracheal deviation.  Cardiovascular:     Rate and Rhythm: Normal rate.     Pulses: Normal pulses.  Pulmonary:     Effort: Pulmonary effort is normal. No accessory muscle usage or respiratory distress.  Abdominal:     General: There is no distension.  Genitourinary:    Comments: No cva tenderness. Musculoskeletal:        General: No swelling.     Cervical back: Neck supple.     Comments: 3 cm laceration to dorsal aspect left hand, tenderness to area. No fb seen. Radial pulse 2+. Normal cap refill in digits. Normal flexion/extension of digits.  Skin:    General: Skin is warm and dry.     Findings: No rash.  Neurological:     Mental Status: He is alert.     Comments: Alert, speech clear. Left hand NVI w grossly intact motor/sens fxn.   Psychiatric:        Mood and Affect: Mood normal.     ED Results / Procedures / Treatments   Labs (all labs ordered are listed, but only abnormal results are displayed) Labs Reviewed - No data to display  EKG None  Radiology DG Hand Complete Left  Result Date: 04/27/2021 CLINICAL DATA:  Left hand pain after crush injury today at work. EXAM: LEFT HAND - COMPLETE 3+ VIEW COMPARISON:  None. FINDINGS: Probable nondisplaced fracture is seen involving the distal tuft of the fourth  distal phalanx. There is no evidence of arthropathy. Dorsal soft tissue laceration is noted. IMPRESSION: Probable nondisplaced fracture seen involving distal tuft of fourth distal phalanx. Electronically Signed   By: Lupita Raider M.D.   On: 04/27/2021 13:35    Procedures .Marland KitchenLaceration Repair  Date/Time: 04/27/2021 2:05 PM Performed by: Cathren Laine, MD Authorized by: Cathren Laine, MD   Consent:    Consent given by:  Patient Anesthesia:    Anesthesia method:  Local infiltration   Local anesthetic:  Lidocaine 2% WITH epi Laceration details:    Location:  Hand   Hand location:  L hand, dorsum   Length (cm):  3 Pre-procedure details:    Preparation:  Patient was prepped and draped in usual sterile fashion and imaging obtained to evaluate for foreign bodies Exploration:    Imaging outcome: foreign body not noted     Wound exploration: wound explored through full range of motion and entire depth of wound visualized     Contaminated: no   Treatment:    Area cleansed with:  Saline   Irrigation solution:  Sterile water   Irrigation volume:  Moderate   Irrigation method:  Syringe   Visualized foreign bodies/material removed: no   Skin repair:    Repair method:  Sutures   Suture size:  4-0   Suture material:  Prolene   Suture technique:  Simple interrupted   Number of sutures:  3 Approximation:    Approximation:  Close Repair type:    Repair type:  Simple Post-procedure details:    Dressing:  Antibiotic ointment and sterile dressing   Procedure completion:  Tolerated well, no immediate complications     Medications Ordered in ED Medications  lidocaine-EPINEPHrine (XYLOCAINE W/EPI) 2 %-1:200000 (PF) injection 20 mL (has no administration in time range)  Tdap (BOOSTRIX) injection 0.5 mL (has no administration in time range)    ED Course  I have reviewed the triage vital signs and the nursing notes.  Pertinent labs & imaging results that were available during my care of  the patient were reviewed by me and considered in my medical decision making (see chart for details).    MDM Rules/Calculators/A&P                         Imaging ordered.   Reviewed nursing notes and prior charts for additional history.   Xrays reviewed/interpreted by me - no fx. (note, no pain, swelling or tenderness in area of 4th finger or tuft).   Tetanus im.  Laceration repair.   Bacitracin and sterile dressing applied.      Final Clinical Impression(s) / ED Diagnoses Final diagnoses:  None    Rx / DC Orders ED Discharge Orders    None       Cathren Laine, MD 04/27/21 1406

## 2021-04-27 NOTE — ED Notes (Signed)
RN reviewed discharge instructions w/ pt. Follow up, wound/suture care reviewed, pt had no further questions.

## 2021-05-12 ENCOUNTER — Ambulatory Visit
Admission: EM | Admit: 2021-05-12 | Discharge: 2021-05-12 | Disposition: A | Discharge status: Home / Self Care | Payer: Self-pay | Attending: Emergency Medicine | Admitting: Emergency Medicine

## 2021-05-12 ENCOUNTER — Other Ambulatory Visit: Payer: Self-pay

## 2021-05-12 NOTE — ED Triage Notes (Signed)
Removed 3 sutures from top of lt hand. No redness or drainage noted.

## 2022-08-25 IMAGING — DX DG HAND COMPLETE 3+V*L*
3 series · 3 of 3 positions shown · non-contrast
Comparison: None.

CLINICAL DATA: Left hand pain after crush injury today at work.

EXAM:
LEFT HAND - COMPLETE 3+ VIEW

[x hand pa left]
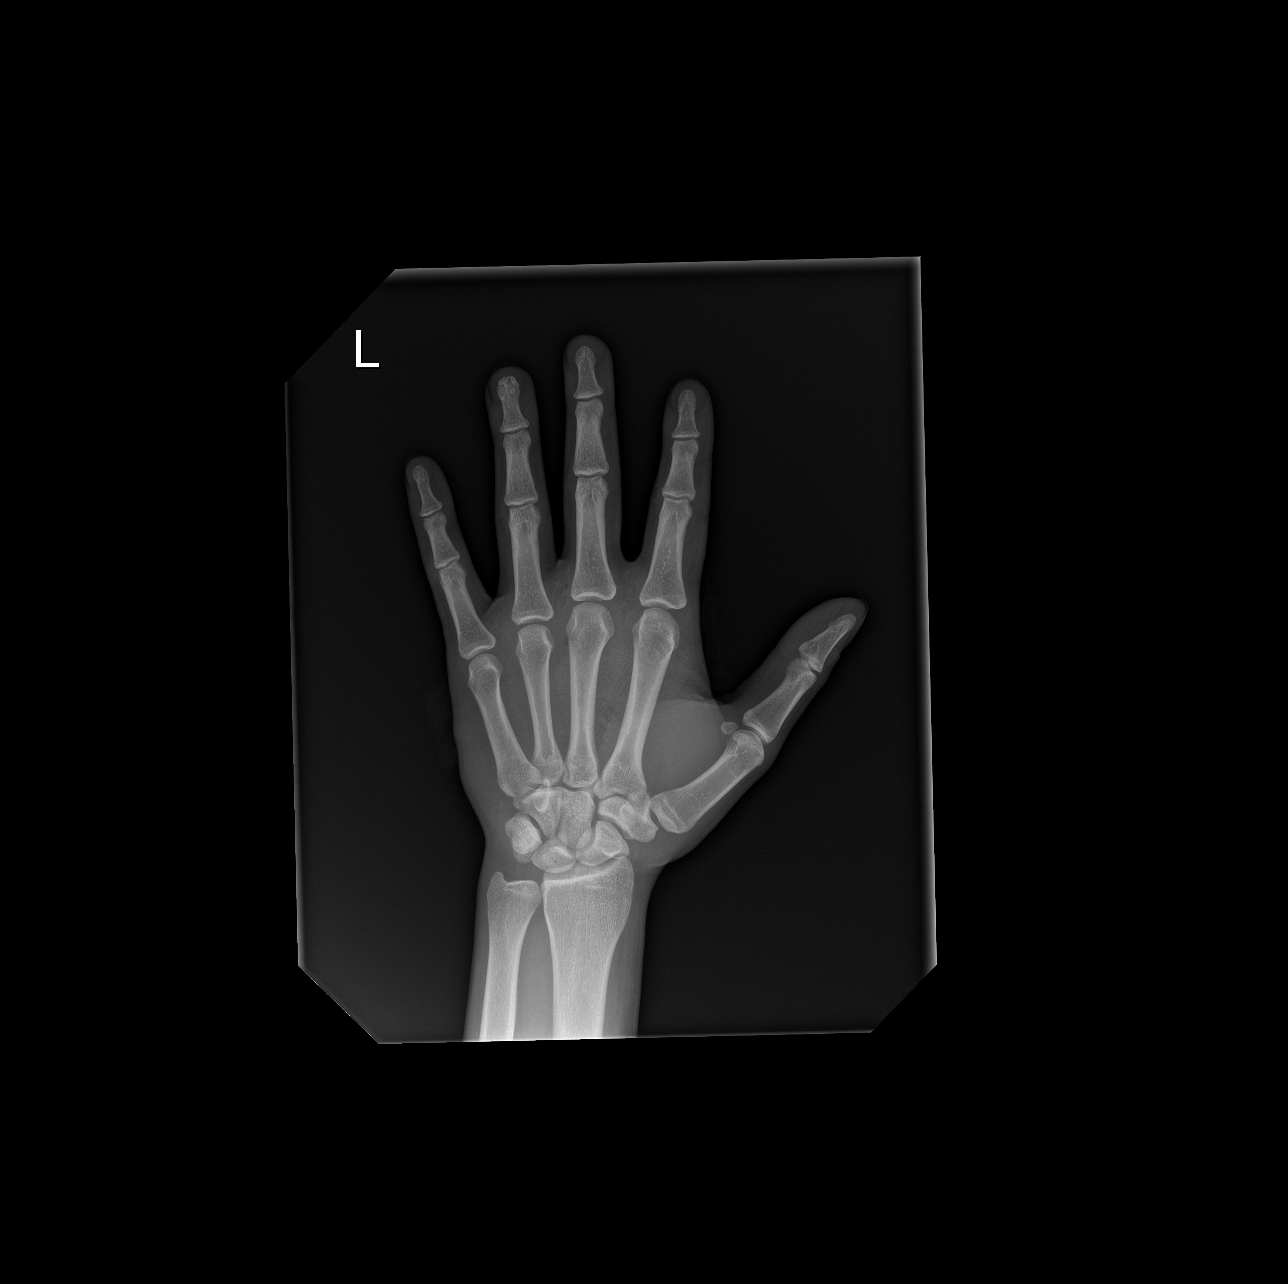

[x hand obl left]
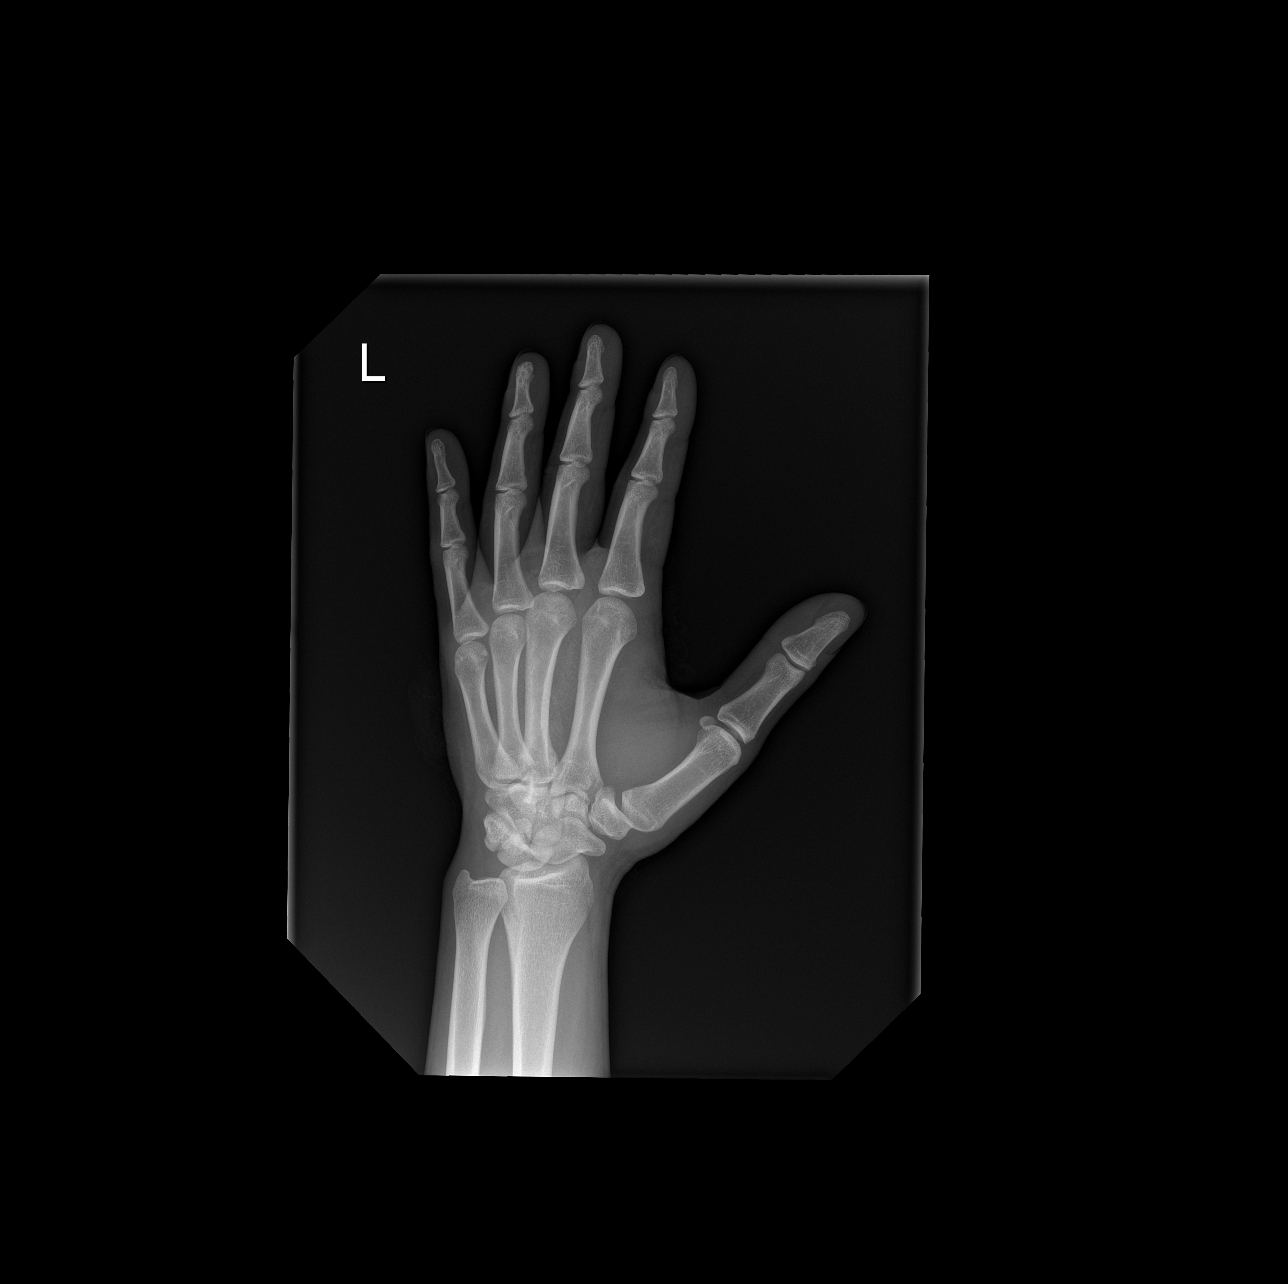

[x hand lat left]
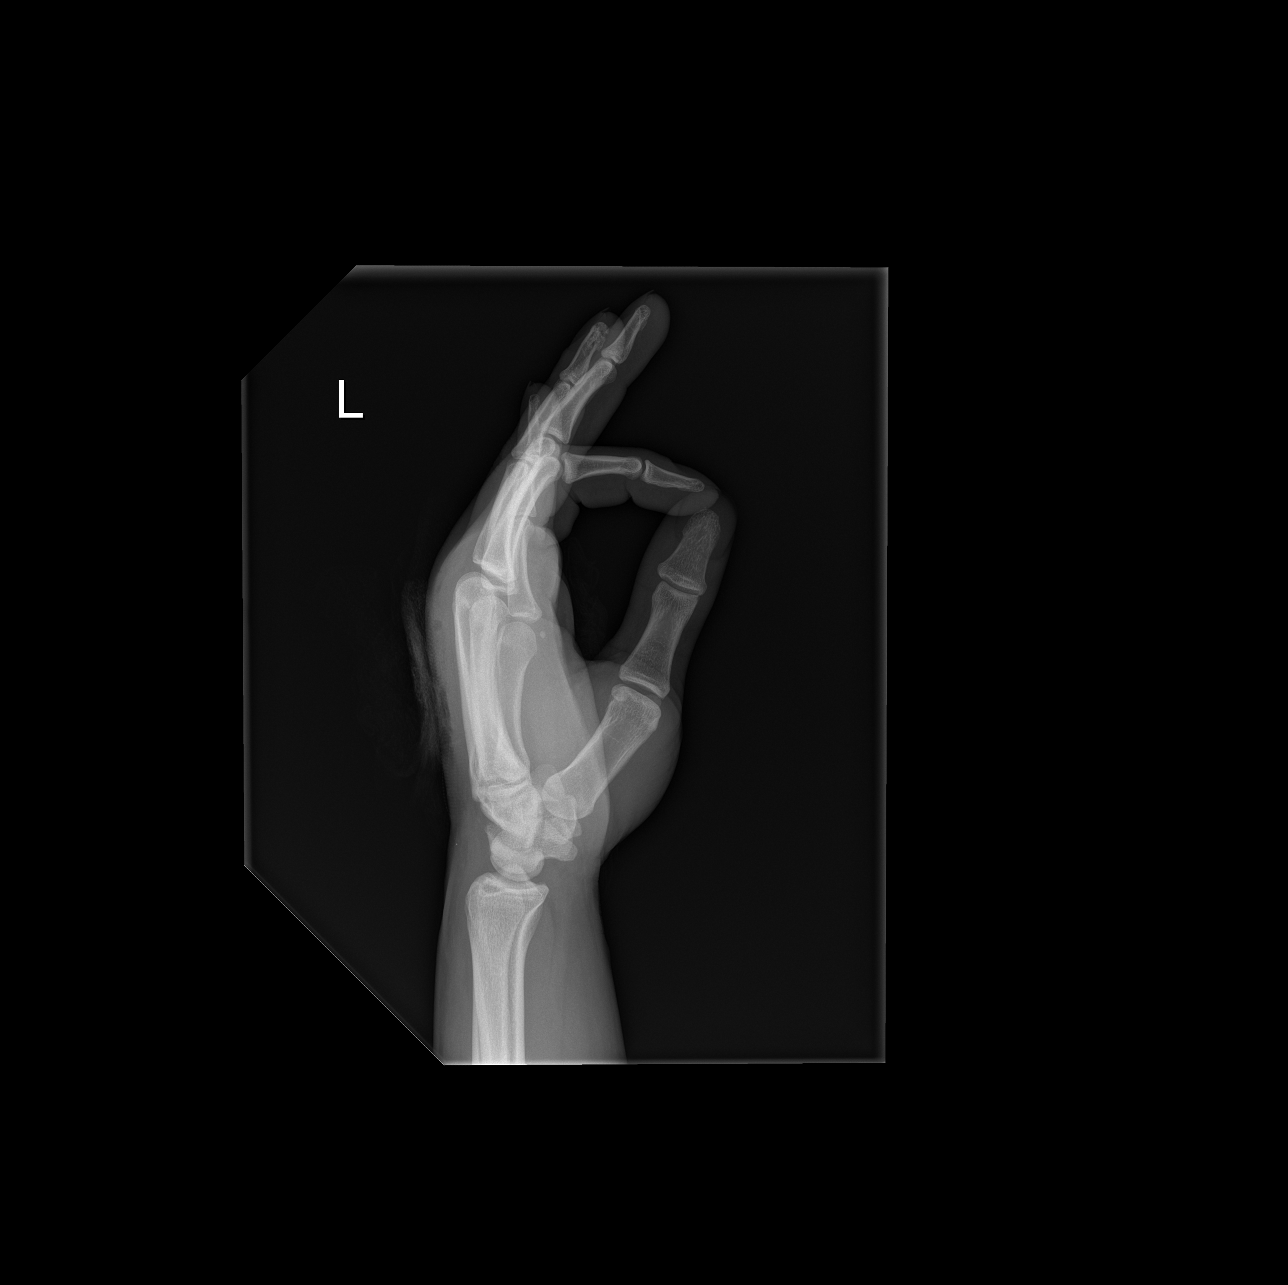

[3 of 3 positions shown; findings below may reference images not displayed]

FINDINGS: Probable nondisplaced fracture is seen involving the distal tuft of
the fourth distal phalanx. There is no evidence of arthropathy.
Dorsal soft tissue laceration is noted.
IMPRESSION: Probable nondisplaced fracture seen involving distal tuft of fourth
distal phalanx.
